# Patient Record
Sex: Male | Born: 1950 | Race: White | Hispanic: No | Marital: Married | Smoking: Never smoker
Health system: Southern US, Community
[De-identification: ages and names within clinical notes are randomized; demographics above are authoritative.]

## PROBLEM LIST (undated history)

## (undated) DIAGNOSIS — H9319 Tinnitus, unspecified ear: Secondary | ICD-10-CM

## (undated) DIAGNOSIS — M791 Myalgia, unspecified site: Secondary | ICD-10-CM

## (undated) DIAGNOSIS — E7800 Pure hypercholesterolemia, unspecified: Secondary | ICD-10-CM

## (undated) DIAGNOSIS — R531 Weakness: Secondary | ICD-10-CM

## (undated) HISTORY — DX: Weakness: R53.1

## (undated) HISTORY — DX: Myalgia, unspecified site: M79.10

## (undated) HISTORY — DX: Tinnitus, unspecified ear: H93.19

## (undated) HISTORY — DX: Pure hypercholesterolemia, unspecified: E78.00

---

## 1983-07-05 HISTORY — PX: OTHER SURGICAL HISTORY: SHX169

## 1991-07-05 HISTORY — PX: NASAL SINUS SURGERY: SHX719

## 2006-10-03 ENCOUNTER — Ambulatory Visit: Payer: Self-pay | Admitting: Gastroenterology

## 2010-02-02 ENCOUNTER — Ambulatory Visit: Payer: Self-pay | Admitting: Cardiology

## 2010-02-10 ENCOUNTER — Ambulatory Visit: Payer: Self-pay | Admitting: Cardiology

## 2010-02-10 ENCOUNTER — Ambulatory Visit (HOSPITAL_COMMUNITY): Admission: RE | Admit: 2010-02-10 | Discharge: 2010-02-10 | Payer: Self-pay | Admitting: Cardiology

## 2010-04-08 ENCOUNTER — Ambulatory Visit: Payer: Self-pay | Admitting: Cardiology

## 2010-10-29 ENCOUNTER — Encounter: Payer: Self-pay | Admitting: Cardiology

## 2010-10-29 DIAGNOSIS — R531 Weakness: Secondary | ICD-10-CM | POA: Insufficient documentation

## 2010-10-29 DIAGNOSIS — H9319 Tinnitus, unspecified ear: Secondary | ICD-10-CM | POA: Insufficient documentation

## 2010-10-29 DIAGNOSIS — M791 Myalgia, unspecified site: Secondary | ICD-10-CM | POA: Insufficient documentation

## 2010-10-29 DIAGNOSIS — H9190 Unspecified hearing loss, unspecified ear: Secondary | ICD-10-CM | POA: Insufficient documentation

## 2010-10-29 DIAGNOSIS — E7800 Pure hypercholesterolemia, unspecified: Secondary | ICD-10-CM | POA: Insufficient documentation

## 2010-11-04 ENCOUNTER — Encounter: Payer: Self-pay | Admitting: Cardiology

## 2010-11-04 ENCOUNTER — Ambulatory Visit (INDEPENDENT_AMBULATORY_CARE_PROVIDER_SITE_OTHER): Payer: BC Managed Care – PPO | Admitting: Cardiology

## 2010-11-04 DIAGNOSIS — R609 Edema, unspecified: Secondary | ICD-10-CM

## 2010-11-04 DIAGNOSIS — E7800 Pure hypercholesterolemia, unspecified: Secondary | ICD-10-CM

## 2010-11-04 DIAGNOSIS — R238 Other skin changes: Secondary | ICD-10-CM

## 2010-11-04 DIAGNOSIS — Z8249 Family history of ischemic heart disease and other diseases of the circulatory system: Secondary | ICD-10-CM

## 2010-11-04 NOTE — Progress Notes (Signed)
Subjective:   Brandon Horn is seen today for followup visit. In general, he seemed primarily for preventive cardiology. He is a Optician, dispensing At [employer]. He is a strong positive family history of heart disease.  He's had some edema of his left lower extremity that occurs mainly with sitting. He has some vague left knee discomfort and also has varicosities that are relatively superficial on leg. Otherwise, he has no significant cardiac issues.  Current Outpatient Prescriptions  Medication Sig Dispense Refill  . aspirin 81 MG tablet Take 81 mg by mouth daily.        Marland Kitchen atorvastatin (LIPITOR) 20 MG tablet Take 20 mg by mouth daily.        . multivitamin (ONE-A-DAY MEN'S) TABS Take by mouth. **GNC**  Multipack for men       . DISCONTD: Prenatal Vit-Fe Fumarate-FA (M-VIT PO) Take by mouth. **GNC**  Multipack for men      . fish oil-omega-3 fatty acids 1000 MG capsule Take 2 g by mouth daily.          No Known Allergies  Patient Active Problem List  Diagnoses  . Hypercholesterolemia  . Myalgia  . Weakness  . Hearing loss  . Ringing in ear    History  Smoking status  . Never Smoker   Smokeless tobacco  . Not on file    History  Alcohol Use No    Family History  Problem Relation Age of Onset  . Heart attack Father 70    Review of Systems:   The patient denies any heat or cold intolerance.  No weight gain or weight loss.  The patient denies headaches or blurry vision.  There is no cough or sputum production.  The patient denies dizziness.  There is no hematuria or hematochezia.  The patient denies any muscle aches or arthritis.  The patient denies any rash.  The patient denies frequent falling or instability.  There is no history of depression or anxiety.  All other systems were reviewed and are negative.   Physical Exam:   Weight is 223. Blood pressure 134/70 sitting, heart rates 80.The head is normocephalic and atraumatic.  Pupils are equally round and reactive to  light.  Sclerae nonicteric.  Conjunctiva is clear.  Oropharynx is unremarkable.  There's adequate oral airway.  Neck is supple there are no masses.  Thyroid is not enlarged.  There is no lymphadenopathy.  Lungs are clear.  Chest is symmetric.  Heart shows a regular rate and rhythm.  S1 and S2 are normal.  There is no murmur click or gallop.  Abdomen is soft normal bowel sounds.  There is no organomegaly.  Genital and rectal deferred.  Extremities are without edema.  Peripheral pulses are adequate.  Neurologically intact.  Full range of motion.  The patient is not depressed.  Skin is warm and dry.  Assessment / Plan:

## 2010-11-05 ENCOUNTER — Encounter: Payer: Self-pay | Admitting: Cardiology

## 2010-11-05 DIAGNOSIS — R609 Edema, unspecified: Secondary | ICD-10-CM | POA: Insufficient documentation

## 2010-11-05 DIAGNOSIS — R238 Other skin changes: Secondary | ICD-10-CM | POA: Insufficient documentation

## 2010-11-05 NOTE — Assessment & Plan Note (Signed)
We'll continue current medications.  Primary care is at [facility]. We'll refer to Dr. Donnie Aho for followup cardiology management.

## 2010-11-05 NOTE — Assessment & Plan Note (Signed)
We'll decrease his aspirin to twice a week. We'll check a CBC with differential to look at his platelet count.

## 2010-11-05 NOTE — Assessment & Plan Note (Signed)
He has minor edema the left leg. It is mainly with sitting. On exam he may have some slight prominence of the popliteal  synovia but I don't think he has a frank Baker's cyst.  He does have slight medial varicosities I think some of this edema is mainly related to venous insufficiency. I've encouraged him to use support stockings and to take occasional aspirin. He is planning a long mission trip to [place] and I encouraged him to wear support stockings on a plane trip. If symptoms persist, he could be referred to Dr. Guss Bunde at the vein clinic.

## 2010-11-08 ENCOUNTER — Other Ambulatory Visit: Payer: Self-pay | Admitting: Cardiology

## 2010-11-08 ENCOUNTER — Other Ambulatory Visit (INDEPENDENT_AMBULATORY_CARE_PROVIDER_SITE_OTHER): Payer: BC Managed Care – PPO | Admitting: *Deleted

## 2010-11-08 DIAGNOSIS — Z79899 Other long term (current) drug therapy: Secondary | ICD-10-CM

## 2010-11-08 DIAGNOSIS — E7800 Pure hypercholesterolemia, unspecified: Secondary | ICD-10-CM

## 2010-11-08 LAB — CBC WITH DIFFERENTIAL/PLATELET
Basophils Absolute: 0 10*3/uL (ref 0.0–0.1)
Eosinophils Relative: 0.9 % (ref 0.0–5.0)
HCT: 41.4 % (ref 39.0–52.0)
Lymphs Abs: 1.9 10*3/uL (ref 0.7–4.0)
MCV: 99.5 fl (ref 78.0–100.0)
Monocytes Absolute: 0.6 10*3/uL (ref 0.1–1.0)
Platelets: 183 10*3/uL (ref 150.0–400.0)
RDW: 14 % (ref 11.5–14.6)

## 2010-11-08 LAB — LIPID PANEL
Cholesterol: 135 mg/dL (ref 0–200)
HDL: 53.8 mg/dL (ref >39.00)
Triglycerides: 46 mg/dL (ref 0.0–149.0)

## 2010-11-08 LAB — HEPATIC FUNCTION PANEL
Alkaline Phosphatase: 47 U/L (ref 39–117)
Bilirubin, Direct: 0.1 mg/dL (ref 0.0–0.3)
Total Bilirubin: 0.9 mg/dL (ref 0.3–1.2)
Total Protein: 6.2 g/dL (ref 6.0–8.3)

## 2010-11-08 LAB — BASIC METABOLIC PANEL
Calcium: 9.1 mg/dL (ref 8.4–10.5)
GFR: 76.12 mL/min (ref >60.00)
Sodium: 140 mEq/L (ref 135–145)

## 2010-11-09 ENCOUNTER — Telehealth: Payer: Self-pay | Admitting: Cardiology

## 2010-11-09 NOTE — Telephone Encounter (Signed)
Lab results reviewed with pt. Pt questioning need for any further change in med or follow-up appt. Dr Deborah Chalk changed his ASA 81mg  daily to 81mg  2x's a week at  Office visit 11/08/10. Informed pt we would call him back if any changes need to be made after discussion with Dr. Deborah Chalk. The best number to reach him on or leave a message at is [phone].

## 2010-12-02 ENCOUNTER — Ambulatory Visit: Payer: Self-pay | Admitting: Cardiology

## 2011-01-10 ENCOUNTER — Other Ambulatory Visit: Payer: Self-pay | Admitting: Cardiology

## 2011-01-10 MED ORDER — ATORVASTATIN CALCIUM 20 MG PO TABS: 20.0000 mg | ORAL_TABLET | Freq: Every day | ORAL | Status: DC

## 2011-01-10 NOTE — Telephone Encounter (Signed)
Med refill

## 2011-08-26 ENCOUNTER — Other Ambulatory Visit: Payer: Self-pay | Admitting: *Deleted

## 2011-09-01 ENCOUNTER — Other Ambulatory Visit: Payer: Self-pay | Admitting: Nurse Practitioner

## 2011-09-01 MED ORDER — ATORVASTATIN CALCIUM 20 MG PO TABS: 20.0000 mg | ORAL_TABLET | Freq: Every day | ORAL | Status: AC

## 2012-12-14 ENCOUNTER — Encounter: Payer: Self-pay | Admitting: Gastroenterology

## 2012-12-24 ENCOUNTER — Encounter: Payer: Self-pay | Admitting: Gastroenterology

## 2012-12-24 ENCOUNTER — Ambulatory Visit (INDEPENDENT_AMBULATORY_CARE_PROVIDER_SITE_OTHER): Payer: BC Managed Care – PPO | Admitting: Gastroenterology

## 2012-12-24 VITALS — BP 110/70 | HR 72 | Ht 76.0 in | Wt 218.5 lb

## 2012-12-24 DIAGNOSIS — K625 Hemorrhage of anus and rectum: Secondary | ICD-10-CM

## 2012-12-24 MED ORDER — NA SULFATE-K SULFATE-MG SULF 17.5-3.13-1.6 GM/177ML PO SOLN: 1.0000 | Freq: Once | ORAL | Status: DC

## 2012-12-24 NOTE — Patient Instructions (Addendum)

## 2012-12-24 NOTE — Assessment & Plan Note (Signed)
Patient has had limited rectal bleeding suggestive of hemorrhoidal bleeding. A more proximal colonic bleeding source should be ruled out.  Recommendations #1 colonoscopy #2 if no abnormalities are seen I will place him on a stool softener or fiber

## 2012-12-24 NOTE — Progress Notes (Signed)
History of Present Illness: Pleasant 62 year old white male referred at the request of Dr. Rosiland Oz for evaluation of rectal bleeding. This has been an intermittent problem for several months consisting of small amounts of bright red blood on the tissue and  occasionally in the water. He tends to have hard stool. He is without abdominal or rectal pain. There has been no change in bowel habits. Patient has never had a colonoscopy.    Past Medical History  Diagnosis Date  . Hypercholesterolemia   . Myalgia   . Weakness   . Hearing loss   . Ringing in ear    Past Surgical History  Procedure Laterality Date  . Nasal sinus surgery  1993  . Eye muscle correction  1985   family history includes Heart attack (age of onset: 56) in his father. Current Outpatient Prescriptions  Medication Sig Dispense Refill  . aspirin 81 MG tablet Take 81 mg by mouth daily.        Marland Kitchen atorvastatin (LIPITOR) 20 MG tablet Take 1 tablet (20 mg total) by mouth daily.  90 tablet  0  . fish oil-omega-3 fatty acids 1000 MG capsule Take 2 g by mouth daily.        . multivitamin (ONE-A-DAY MEN'S) TABS Take by mouth. **GNC**  Multipack for men       . [DISCONTINUED] Prenatal Vit-Fe Fumarate-FA (M-VIT PO) Take by mouth. **GNC**  Multipack for men       No current facility-administered medications for this visit.   Allergies as of 12/24/2012  . (No Known Allergies)    reports that he has never smoked. He does not have any smokeless tobacco history on file. He reports that he does not drink alcohol or use illicit drugs.     Review of Systems: Pertinent positive and negative review of systems were noted in the above HPI section. All other review of systems were otherwise negative.  Vital signs were reviewed in today's medical record Physical Exam: General: Well developed , well nourished, no acute distress Skin: anicteric Head: Normocephalic and atraumatic Eyes:  sclerae anicteric, EOMI Ears: Normal auditory  acuity Mouth: No deformity or lesions Neck: Supple, no masses or thyromegaly Lungs: Clear throughout to auscultation Heart: Regular rate and rhythm; no murmurs, rubs or bruits Abdomen: Soft, non tender and non distended. No masses, hepatosplenomegaly or hernias noted. Normal Bowel sounds Rectal:deferred Musculoskeletal: Symmetrical with no gross deformities  Skin: No lesions on visible extremities Pulses:  Normal pulses noted Extremities: No clubbing, cyanosis, edema or deformities noted Neurological: Alert oriented x 4, grossly nonfocal Cervical Nodes:  No significant cervical adenopathy Inguinal Nodes: No significant inguinal adenopathy Psychological:  Alert and cooperative. Normal mood and affect

## 2013-01-22 ENCOUNTER — Encounter: Payer: Self-pay | Admitting: Gastroenterology

## 2013-01-22 ENCOUNTER — Ambulatory Visit (AMBULATORY_SURGERY_CENTER): Payer: BC Managed Care – PPO | Admitting: Gastroenterology

## 2013-01-22 VITALS — BP 117/74 | HR 54 | Temp 97.5°F | Resp 28 | Ht 76.0 in | Wt 218.0 lb

## 2013-01-22 DIAGNOSIS — K625 Hemorrhage of anus and rectum: Secondary | ICD-10-CM

## 2013-01-22 DIAGNOSIS — Z1211 Encounter for screening for malignant neoplasm of colon: Secondary | ICD-10-CM

## 2013-01-22 MED ORDER — SODIUM CHLORIDE 0.9 % IV SOLN: 500.0000 mL | INTRAVENOUS | Status: DC

## 2013-01-22 NOTE — Progress Notes (Signed)
Patient did not experience any of the following events: a burn prior to discharge; a fall within the facility; wrong site/side/patient/procedure/implant event; or a hospital transfer or hospital admission upon discharge from the facility. (G8907) Patient did not have preoperative order for IV antibiotic SSI prophylaxis. (G8918)  

## 2013-01-22 NOTE — Patient Instructions (Addendum)
Discharge instructions given with verbal understanding. Normal exam. Resume previous medications. YOU HAD AN ENDOSCOPIC PROCEDURE TODAY AT THE [facility]: Refer to the procedure report that was given to you for any specific questions about what was found during the examination.  If the procedure report does not answer your questions, please call your gastroenterologist to clarify.  If you requested that your care partner not be given the details of your procedure findings, then the procedure report has been included in a sealed envelope for you to review at your convenience later.  YOU SHOULD EXPECT: Some feelings of bloating in the abdomen. Passage of more gas than usual.  Walking can help get rid of the air that was put into your GI tract during the procedure and reduce the bloating. If you had a lower endoscopy (such as a colonoscopy or flexible sigmoidoscopy) you may notice spotting of blood in your stool or on the toilet paper. If you underwent a bowel prep for your procedure, then you may not have a normal bowel movement for a few days.  DIET: Your first meal following the procedure should be a light meal and then it is ok to progress to your normal diet.  A half-sandwich or bowl of soup is an example of a good first meal.  Heavy or fried foods are harder to digest and may make you feel nauseous or bloated.  Likewise meals heavy in dairy and vegetables can cause extra gas to form and this can also increase the bloating.  Drink plenty of fluids but you should avoid alcoholic beverages for 24 hours.  ACTIVITY: Your care partner should take you home directly after the procedure.  You should plan to take it easy, moving slowly for the rest of the day.  You can resume normal activity the day after the procedure however you should NOT DRIVE or use heavy machinery for 24 hours (because of the sedation medicines used during the test).    SYMPTOMS TO REPORT IMMEDIATELY: A gastroenterologist  can be reached at any hour.  During normal business hours, 8:30 AM to 5:00 PM Monday through Friday, call [phone].  After hours and on weekends, please call the GI answering service at [phone] who will take a message and have the physician on call contact you.   Following lower endoscopy (colonoscopy or flexible sigmoidoscopy):  Excessive amounts of blood in the stool  Significant tenderness or worsening of abdominal pains  Swelling of the abdomen that is new, acute  Fever of 100F or higher  FOLLOW UP: If any biopsies were taken you will be contacted by phone or by letter within the next 1-3 weeks.  Call your gastroenterologist if you have not heard about the biopsies in 3 weeks.  Our staff will call the home number listed on your records the next business day following your procedure to check on you and address any questions or concerns that you may have at that time regarding the information given to you following your procedure. This is a courtesy call and so if there is no answer at the home number and we have not heard from you through the emergency physician on call, we will assume that you have returned to your regular daily activities without incident.  SIGNATURES/CONFIDENTIALITY: You and/or your care partner have signed paperwork which will be entered into your electronic medical record.  These signatures attest to the fact that that the information above on your After Visit Summary has been reviewed   and is understood.  Full responsibility of the confidentiality of this discharge information lies with you and/or your care-partner. 

## 2013-01-22 NOTE — Progress Notes (Signed)
A/ox3 pleased with MAC, report to Celia RN 

## 2013-01-22 NOTE — Op Note (Signed)
[facility] [address] [place], [address]   COLONOSCOPY PROCEDURE REPORT  PATIENT: Brandon Horn, Brandon Horn  MR#: [id number] BIRTHDATE: [birth year] , 61  yrs. old GENDER: Male ENDOSCOPIST: Louis Meckel, MD REFERRED BY: PROCEDURE DATE:  01/22/2013 PROCEDURE:   Colonoscopy, diagnostic ASA CLASS:   Class I INDICATIONS: MEDICATIONS: MAC sedation, administered by CRNA and propofol (Diprivan) 300mg  IV  DESCRIPTION OF PROCEDURE:   After the risks benefits and alternatives of the procedure were thoroughly explained, informed consent was obtained.  A digital rectal exam revealed no abnormalities of the rectum.   The LB [id number]  endoscope was introduced through the anus and advanced to the cecum, which was identified by both the appendix and ileocecal valve. No adverse events experienced.   The quality of the prep was excellent using Suprep  The instrument was then slowly withdrawn as the colon was fully examined.      COLON FINDINGS:   A normal appearing cecum, ileocecal valve, and appendiceal orifice were identified.  The ascending, hepatic flexure, transverse, splenic flexure, descending, sigmoid colon and rectum appeared unremarkable.  No polyps or cancers were seen. Retroflexed views revealed no abnormalities. The time to cecum=6 minutes 22 seconds.  Withdrawal time=8 minutes 03 seconds.  The scope was withdrawn and the procedure completed. COMPLICATIONS: There were no complications.  ENDOSCOPIC IMPRESSION: 1.    normal colon  RECOMMENDATIONS: Continue current colorectal screening recommendations for "routine risk" patients with a repeat colonoscopy in 10 years.   eSigned:  Louis Meckel, MD 01/22/2013 3:23 PM   cc: Ellwood Handler, M.D.

## 2013-01-23 ENCOUNTER — Telehealth: Payer: Self-pay

## 2013-01-23 NOTE — Telephone Encounter (Signed)
  Follow up Call-  Call back number 01/22/2013  Post procedure Call Back phone  # [phone]  Permission to leave phone message Yes     Patient questions:  Do you have a fever, pain , or abdominal swelling? no Pain Score  0 *  Have you tolerated food without any problems? yes  Have you been able to return to your normal activities? yes  Do you have any questions about your discharge instructions: Diet   no Medications  no Follow up visit  no  Do you have questions or concerns about your Care? no  Actions: * If pain score is 4 or above: No action needed, pain <4.  No problems per the pt. Maw

## 2017-09-26 ENCOUNTER — Encounter (INDEPENDENT_AMBULATORY_CARE_PROVIDER_SITE_OTHER): Payer: Self-pay

## 2017-09-26 ENCOUNTER — Ambulatory Visit: Payer: Medicare Other | Admitting: Neurology

## 2017-09-26 ENCOUNTER — Telehealth: Payer: Self-pay | Admitting: Neurology

## 2017-09-26 ENCOUNTER — Encounter: Payer: Self-pay | Admitting: Neurology

## 2017-09-26 ENCOUNTER — Other Ambulatory Visit: Payer: Self-pay

## 2017-09-26 VITALS — BP 126/80 | HR 75 | Ht 76.0 in | Wt 228.5 lb

## 2017-09-26 DIAGNOSIS — E538 Deficiency of other specified B group vitamins: Secondary | ICD-10-CM | POA: Diagnosis not present

## 2017-09-26 DIAGNOSIS — Z5181 Encounter for therapeutic drug level monitoring: Secondary | ICD-10-CM | POA: Diagnosis not present

## 2017-09-26 DIAGNOSIS — R404 Transient alteration of awareness: Secondary | ICD-10-CM

## 2017-09-26 NOTE — Telephone Encounter (Signed)
[employer] medicare Berkley Harveyauth: [id number] (exp. 09/26/17 to 10/25/17) order sent to GI they will reach out to the patient to schedule.

## 2017-09-26 NOTE — Patient Instructions (Signed)
   We will check MRI of the brain and check an EEG study. We will get a carotid doppler.

## 2017-09-26 NOTE — Progress Notes (Signed)
Reason for visit: Transient alteration of awareness  Referring physician: Dr. Alinda Horn is a 67 y.o. male  History of present illness:  Brandon Horn is a 67 year old left-handed white male with a history of episodes of flushing sensations that have been present off and on since 2011.  As times gone on, these episodes have become a bit more frequent.  He initially attributed this to multivitamins that contain niacin.  He went off the vitamins but the episodes continued.  The patient has had some episodes as well with the flushing of some sensation of gurgling in the abdomen, he has not had any of these events in over one year.  The patient has gone on to continue to have episodes of flushing that has a sensation mainly on the left side of the body that may include the upper left leg, body, and arm.  Beginning in December 2018, the patient has also had episodes of cognitive alteration with these episodes.  On one occasion he had a cold sensation involving the left arm.  The patient is having very frequent events at this point, essentially daily, he may have multiple episodes in a day.  The episodes last only about 5 seconds and resolve.  If he is talking, he may have difficulty getting the words out, one episode occurred while playing the guitar, he was unable to continue playing.  Once the event is over, he returns back to normal cognitive functioning.  The patient does not lose consciousness.  He denies headaches or dizziness.  He has no history of migraine.  His sister has a history of generalized tonic-clonic seizures.  The patient reports no weakness or persistent numbness of the arms or legs or face.  He denies issues with balance or difficulty controlling the bowels or the bladder.  He has had events while driving which fortunately have not impaired his ability to operate a motor vehicle.  He comes to this office for an evaluation.  Past Medical History:  Diagnosis Date  . Hearing  loss   . Hypercholesterolemia   . Myalgia   . Ringing in ear   . Weakness     Past Surgical History:  Procedure Laterality Date  . EYE MUSCLE CORRECTION  1985  . NASAL SINUS SURGERY  1993    Family History  Problem Relation Age of Onset  . Heart attack Father 36  . Seizures Sister     Social history:  reports that he has never smoked. He has never used smokeless tobacco. He reports that he does not drink alcohol or use drugs.  Medications:  Prior to Admission medications   Medication Sig Start Date End Date Taking? Authorizing Provider  aspirin 81 MG tablet Take 81 mg by mouth daily.     Yes [provider]  atorvastatin (LIPITOR) 20 MG tablet Take 1 tablet (20 mg total) by mouth daily. 09/01/11  Yes Rosalio Macadamia, NP  Prenatal Vit-Fe Fumarate-FA (M-VIT PO) Take by mouth. **GNC**  Multipack for men  11/04/10  [provider]     No Known Allergies  ROS:  Out of a complete 14 system review of symptoms, the patient complains only of the following symptoms, and all other reviewed systems are negative.  Swelling in the legs Ringing in the ears Transient confusion  Blood pressure 126/80, pulse 75, height 6\' 4"  (1.93 m), weight 228 lb 8 oz (103.6 kg).  Physical Exam  General: The patient is alert and cooperative  at the time of the examination.  Eyes: Pupils are equal, round, and reactive to light. Discs are flat bilaterally.  Neck: The neck is supple, no carotid bruits are noted.  Respiratory: The respiratory examination is clear.  Cardiovascular: The cardiovascular examination reveals a regular rate and rhythm, no obvious murmurs or rubs are noted.  Skin: Extremities are without significant edema.  Neurologic Exam  Mental status: The patient is alert and oriented x 3 at the time of the examination. The patient has apparent normal recent and remote memory, with an apparently normal attention span and concentration ability.  Cranial nerves: Facial  symmetry is present. There is good sensation of the face to pinprick and soft touch bilaterally. The strength of the facial muscles and the muscles to head turning and shoulder shrug are normal bilaterally. Speech is well enunciated, no aphasia or dysarthria is noted. Extraocular movements are full. Visual fields are full. The tongue is midline, and the patient has symmetric elevation of the soft palate. No obvious hearing deficits are noted.  Motor: The motor testing reveals 5 over 5 strength of all 4 extremities. Good symmetric motor tone is noted throughout.  Sensory: Sensory testing is intact to pinprick, soft touch, vibration sensation, and position sense on all 4 extremities, with exception of a stocking pattern pinprick sensory deficit across the ankles bilaterally and some reduction in vibration sensation in both feet. No evidence of extinction is noted.  Coordination: Cerebellar testing reveals good finger-nose-finger and heel-to-shin bilaterally.  Gait and station: Gait is normal. Tandem gait is normal. Romberg is negative. No drift is seen.  Reflexes: Deep tendon reflexes are symmetric and normal bilaterally. Toes are downgoing bilaterally.   Assessment/Plan:  1.  Transient alteration of awareness  The patient is having multiple episodes of left-sided flushing sensations, and some cognitive alterations lasting only 5 or 10 seconds.  The episodes may represent brief seizure events, TIA events are less likely.  The patient will be set up for MRI of the brain with and without gadolinium enhancement.  He will have blood work today.  The patient will have an EEG study, he will have a carotid Doppler study.  He will follow-up in 5 months.  We may potentially consider an empiric trial on antiepileptic medications.  Brandon Horn. Brandon Braylee Lal MD 09/26/2017 3:04 PM  [facility] [address] [place], [place] [id number]  Phone [phone] Fax [phone]

## 2017-09-26 NOTE — Telephone Encounter (Signed)
Dr. Anne HahnWillis requested for patient to have a 5 month follow-up. Patient stated that he is moving in June, 2019. He asked if he could be seen before he moves. Received RN approval and scheduled patient for the end of May.

## 2017-09-27 ENCOUNTER — Other Ambulatory Visit: Payer: Self-pay | Admitting: Neurology

## 2017-09-27 ENCOUNTER — Telehealth: Payer: Self-pay | Admitting: *Deleted

## 2017-09-27 DIAGNOSIS — T1592XS Foreign body on external eye, part unspecified, left eye, sequela: Secondary | ICD-10-CM

## 2017-09-27 LAB — COMPREHENSIVE METABOLIC PANEL
ALT: 20 IU/L (ref 0–44)
AST: 14 IU/L (ref 0–40)
Albumin/Globulin Ratio: 2 (ref 1.2–2.2)
Albumin: 4.2 g/dL (ref 3.6–4.8)
Alkaline Phosphatase: 59 IU/L (ref 39–117)
BILIRUBIN TOTAL: 0.3 mg/dL (ref 0.0–1.2)
BUN/Creatinine Ratio: 19 (ref 10–24)
BUN: 20 mg/dL (ref 8–27)
CALCIUM: 9.1 mg/dL (ref 8.6–10.2)
CHLORIDE: 104 mmol/L (ref 96–106)
CO2: 25 mmol/L (ref 20–29)
Creatinine, Ser: 1.03 mg/dL (ref 0.76–1.27)
GFR calc non Af Amer: 76 mL/min/{1.73_m2} (ref >59)
GFR, EST AFRICAN AMERICAN: 88 mL/min/{1.73_m2} (ref >59)
GLUCOSE: 99 mg/dL (ref 65–99)
Globulin, Total: 2.1 g/dL (ref 1.5–4.5)
Potassium: 4 mmol/L (ref 3.5–5.2)
Sodium: 141 mmol/L (ref 134–144)
TOTAL PROTEIN: 6.3 g/dL (ref 6.0–8.5)

## 2017-09-27 LAB — VITAMIN B12: VITAMIN B 12: 487 pg/mL (ref 232–1245)

## 2017-09-27 NOTE — Telephone Encounter (Signed)
Called, LVM for pt to call about lab results.   Okay to inform him labs unremarkable per CW,MD note if he calls

## 2017-09-27 NOTE — Telephone Encounter (Signed)
FYI Pt returned the call and he was advised labs unremarkable , no questions no request for a call back

## 2017-09-27 NOTE — Telephone Encounter (Signed)
Noted, thank you

## 2017-09-27 NOTE — Telephone Encounter (Signed)
-----   Message from York Spanielharles K Willis, MD sent at 09/27/2017  7:25 AM EDT -----   The blood work results are unremarkable. Please call the patient.  ----- Message ----- From: Nell RangeInterface, [facility] Lab Results In Sent: 09/27/2017   5:40 AM To: York Spanielharles K Willis, MD

## 2017-10-06 ENCOUNTER — Ambulatory Visit
Admission: RE | Admit: 2017-10-06 | Discharge: 2017-10-06 | Disposition: A | Discharge status: Home / Self Care | Payer: Medicare Other | Source: Ambulatory Visit | Attending: Neurology | Admitting: Neurology

## 2017-10-06 DIAGNOSIS — R404 Transient alteration of awareness: Secondary | ICD-10-CM

## 2017-10-06 DIAGNOSIS — T1592XS Foreign body on external eye, part unspecified, left eye, sequela: Secondary | ICD-10-CM

## 2017-10-08 ENCOUNTER — Telehealth: Payer: Self-pay | Admitting: Neurology

## 2017-10-08 DIAGNOSIS — R404 Transient alteration of awareness: Secondary | ICD-10-CM

## 2017-10-08 NOTE — Telephone Encounter (Signed)
I called the patient.  The orbital x-ray showed metal, the patient cannot have MRI.  If he is amenable to it, we will check CT scan of the brain instead.  Renal function has been checked recently and is unremarkable.  The patient will call our office if he is amenable to having a CT.

## 2017-10-09 ENCOUNTER — Telehealth: Payer: Self-pay | Admitting: Neurology

## 2017-10-09 NOTE — Telephone Encounter (Signed)
I called the patient, I will go ahead and schedule the CT of the head.

## 2017-10-09 NOTE — Telephone Encounter (Signed)
Patient called and I advised an order will be put in for a CT of the head per previous message and someone will call him to schedule. A returned call is not needed.

## 2017-10-09 NOTE — Telephone Encounter (Signed)
Patient called in stating that he would like a call back from Dr. Anne HahnWillis or the nurse. He informed me that he had a Xray done on Friday and it showed metal. They informed me that he could have a CT but he hasn't heard anything about scheduling a CT.

## 2017-10-09 NOTE — Addendum Note (Signed)
Addended by: York SpanielWILLIS, Binyomin Brann K on: 10/09/2017 01:28 PM   Modules accepted: Orders

## 2017-10-09 NOTE — Telephone Encounter (Signed)
[employer] Berkley Harveyauth: [id number] (exp. 10/09/17 to 11/07/17) order sent to GI. They will reach out to the pt to schedule.

## 2017-10-19 ENCOUNTER — Ambulatory Visit
Admission: RE | Admit: 2017-10-19 | Discharge: 2017-10-19 | Disposition: A | Discharge status: Home / Self Care | Payer: Medicare Other | Source: Ambulatory Visit | Attending: Neurology | Admitting: Neurology

## 2017-10-19 ENCOUNTER — Telehealth: Payer: Self-pay | Admitting: Neurology

## 2017-10-19 DIAGNOSIS — R404 Transient alteration of awareness: Secondary | ICD-10-CM

## 2017-10-19 DIAGNOSIS — R569 Unspecified convulsions: Secondary | ICD-10-CM

## 2017-10-19 MED ORDER — LEVETIRACETAM 500 MG PO TABS: 500.0000 mg | ORAL_TABLET | Freq: Two times a day (BID) | ORAL | 3 refills | Status: AC

## 2017-10-19 MED ORDER — IOPAMIDOL (ISOVUE-300) INJECTION 61%
75.0000 mL | Freq: Once | INTRAVENOUS | Status: AC | PRN
  Administered 2017-10-19: 75 mL via INTRAVENOUS

## 2017-10-19 NOTE — Telephone Encounter (Signed)
  I called the patient.  The CT scan of the brain shows a lesion within the left temporal lobe with calcification, may represent an AVM or an oligodendroglioma or a ganglioneuroma.  The metal that they saw on the orbital films appears to be intraosseous, it is felt that MRI of the brain would be safe, I will get this ordered.  Given this brain lesion, the clinical episodes likely do represent seizures, I will start Keppra taking 250 mg twice daily for 2 weeks and then go to 500 mg twice daily.  CT brain 10/19/17:  IMPRESSION: 1. Hyperdense, partially calcified, and perhaps mildly enhancing round soft tissue mass at the mesial right temporal lobe at the amygdala. Follow-up brain MRI without and with contrast (see #4) would best characterize further. Top differential considerations at this point include ganglioglioma, oligodendroglioma, CNS lymphoma, less likely meningioma, high-grade glioma, or AVM. 2. Normal for age CT appearance of the brain elsewhere. 3. Partially visible nonspecific left suboccipital scalp soft tissue nodule versus mildly enlarged level 5 lymph node. Is there a dermal lesion evident on physical exam which might explain this finding (sebaceous cyst)?. 4. The left orbit associated metallic foreign bodies are intraosseous. MRI of the brain in this patient should be safe to perform with regard to the orbits.

## 2017-10-20 ENCOUNTER — Telehealth: Payer: Self-pay | Admitting: Neurology

## 2017-10-20 NOTE — Telephone Encounter (Signed)
Pt is concerned after talking with Dr Anne HahnWillis yesterday if he feels he needs to keep appt for carotid on 4/24 and EEG on 5/13. Pt said he does not want to go thru a battery of test if it is not needed. Dr Anne HahnWillis please call to advise

## 2017-10-20 NOTE — Telephone Encounter (Signed)
I called the patient.  The patient appropriately is wondering whether the EEG study and the carotid Doppler study is reasonable to do at this time, given the findings by CT of the brain, I will cancel both of the studies.  The patient likely is having seizures, whether the EEG is normal or not he will be treated with anticonvulsant therapy.

## 2017-10-23 ENCOUNTER — Telehealth: Payer: Self-pay | Admitting: Neurology

## 2017-10-23 NOTE — Telephone Encounter (Signed)
According to the radiologist who read the CT scan, Dr. Margo AyeHall, the patient is safe to have MRI as the metal appears to be encased within bone.

## 2017-10-23 NOTE — Telephone Encounter (Signed)
Noted, I call [facility] back and informed them per Dr. Anne HahnWillis and Dr. Margo AyeHall the patient is okay to have the MRI due to the metal is encased within bone.

## 2017-10-23 NOTE — Telephone Encounter (Signed)
Patient is scheduled at GI for 10/26/17 for the MRI. [employer] Medicare Berkley Harveyauth: [id number] (exp. 10/23/17 to 11/21/17)

## 2017-10-23 NOTE — Telephone Encounter (Signed)
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

## 2017-10-25 ENCOUNTER — Ambulatory Visit (HOSPITAL_COMMUNITY): Payer: Medicare Other

## 2017-10-26 ENCOUNTER — Other Ambulatory Visit: Payer: Medicare Other

## 2017-10-26 ENCOUNTER — Ambulatory Visit
Admission: RE | Admit: 2017-10-26 | Discharge: 2017-10-26 | Disposition: A | Discharge status: Home / Self Care | Payer: Medicare Other | Source: Ambulatory Visit | Attending: Neurology | Admitting: Neurology

## 2017-10-26 DIAGNOSIS — R569 Unspecified convulsions: Secondary | ICD-10-CM

## 2017-10-26 MED ORDER — GADOBENATE DIMEGLUMINE 529 MG/ML IV SOLN
20.0000 mL | Freq: Once | INTRAVENOUS | Status: AC | PRN
  Administered 2017-10-26: 20 mL via INTRAVENOUS

## 2017-10-27 NOTE — Progress Notes (Signed)
Please advise pt that the recent MRI confirms a mass in the L temporal area, similar to what was noted on the prior head CT. Dr. Anne HahnWillis ordered an MRI w/wo contrast, but it appears, he did not receive contrast, I am not sure why. He would benefit from a contrasted MRI, which I can order after you talk to him. Furthermore, he may benefit from consultations with NSG and/or neurooncology, I am not certain, if this has been discussed with Dr. Anne HahnWillis yet? Pls let patient know, that no other new finding was seen, no stroke either. Can you find out, why contrast was not given? Kidney function is okay.  Janene Harveysa

## 2017-10-30 ENCOUNTER — Telehealth: Payer: Self-pay | Admitting: *Deleted

## 2017-10-30 NOTE — Telephone Encounter (Addendum)
Error

## 2017-10-31 ENCOUNTER — Telehealth: Payer: Self-pay | Admitting: Neurology

## 2017-10-31 DIAGNOSIS — G9389 Other specified disorders of brain: Secondary | ICD-10-CM

## 2017-10-31 NOTE — Telephone Encounter (Signed)
I called the patient.  The area of tumor does enhance slightly, the next step would be to get a neurosurgical opinion regarding this, I will try to get this set up.  The patient is amenable to this.    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 Contrast MRI would be beneficial.There are incidental changes of chronic paranasal sinusitis.  I have reviewed MRI scan of the brain with contrast and only axial images are available for review for some reason coronal and sagittal images are not obtained or available at the present time. Postcontrast axial images show spotty enhancement in the right posterior medial temporal lesion. The differential diagnosis now becomes dysembryoplastic neuroectodermal tumor ,gangliocytoma, subependymoma myeloma. Cavernoma is less likely. Case discussed with Dr. Augusto Gamble neuroradiologist.

## 2017-10-31 NOTE — Telephone Encounter (Signed)
I have already called the patient, we will set up a neurosurgical referral, please refer to phone note.

## 2017-10-31 NOTE — Telephone Encounter (Signed)
Pt requesting a call to discuss his MRI results as soon as possible

## 2017-11-01 NOTE — Telephone Encounter (Signed)
Called [facility] and spoke with Lauren in MRI department. Advised coronal and sagittal images missing. We have axial images. They are showing on their end.  They will resend for [place] to review.

## 2017-11-03 ENCOUNTER — Ambulatory Visit: Payer: Medicare Other | Admitting: Neurology

## 2017-11-06 ENCOUNTER — Other Ambulatory Visit: Payer: Medicare Other

## 2017-11-06 ENCOUNTER — Encounter

## 2017-11-13 ENCOUNTER — Other Ambulatory Visit: Payer: Medicare Other

## 2017-11-30 ENCOUNTER — Ambulatory Visit: Payer: Medicare Other | Admitting: Neurology

## 2019-07-29 IMAGING — CR DG ORBITS FOR FOREIGN BODY
2 series · 2 of 2 positions shown · non-contrast
Comparison: None.

CLINICAL DATA: Altered level of consciousness.  Pre screening MRI.

EXAM:
ORBITS FOR FOREIGN BODY - 2 VIEW

[w orbit pa (1 of 2)]
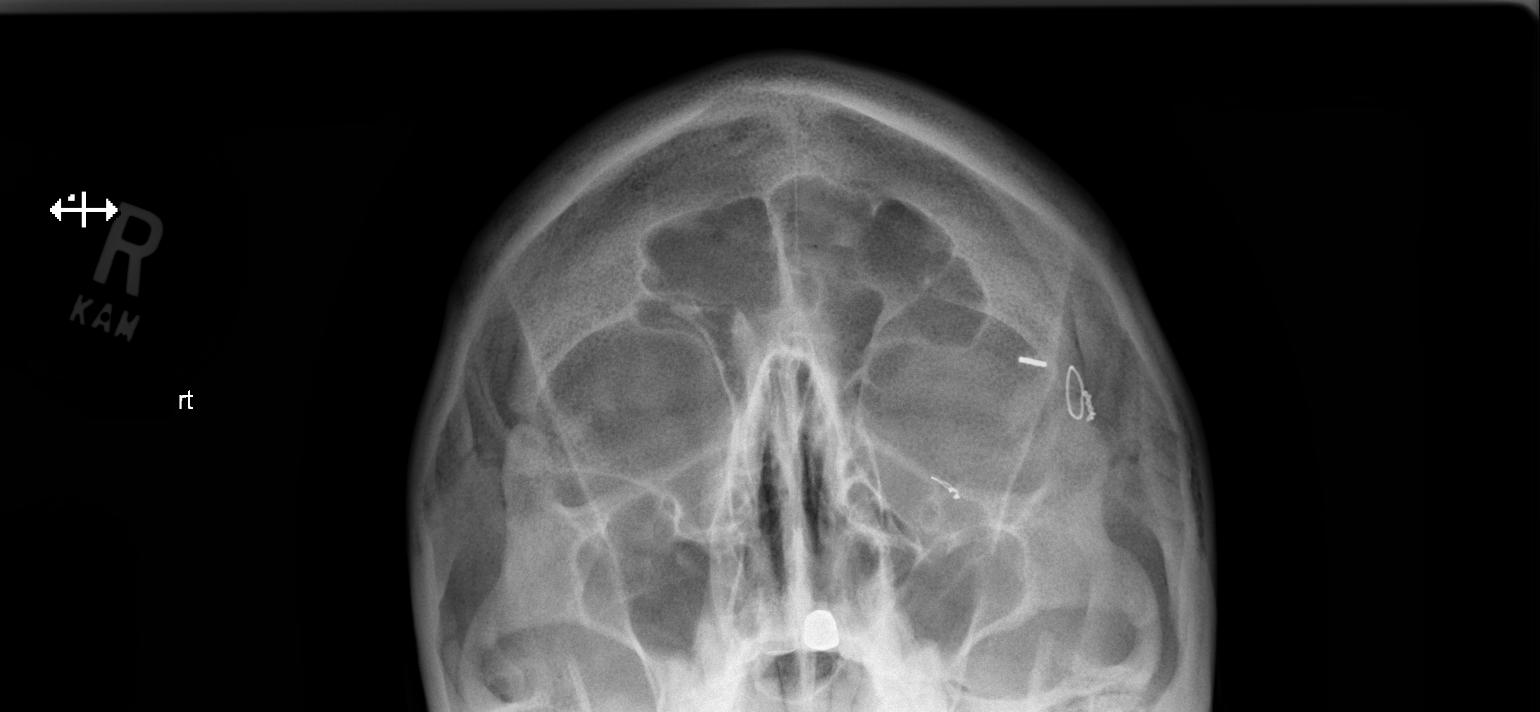

[w orbit pa (2 of 2)]
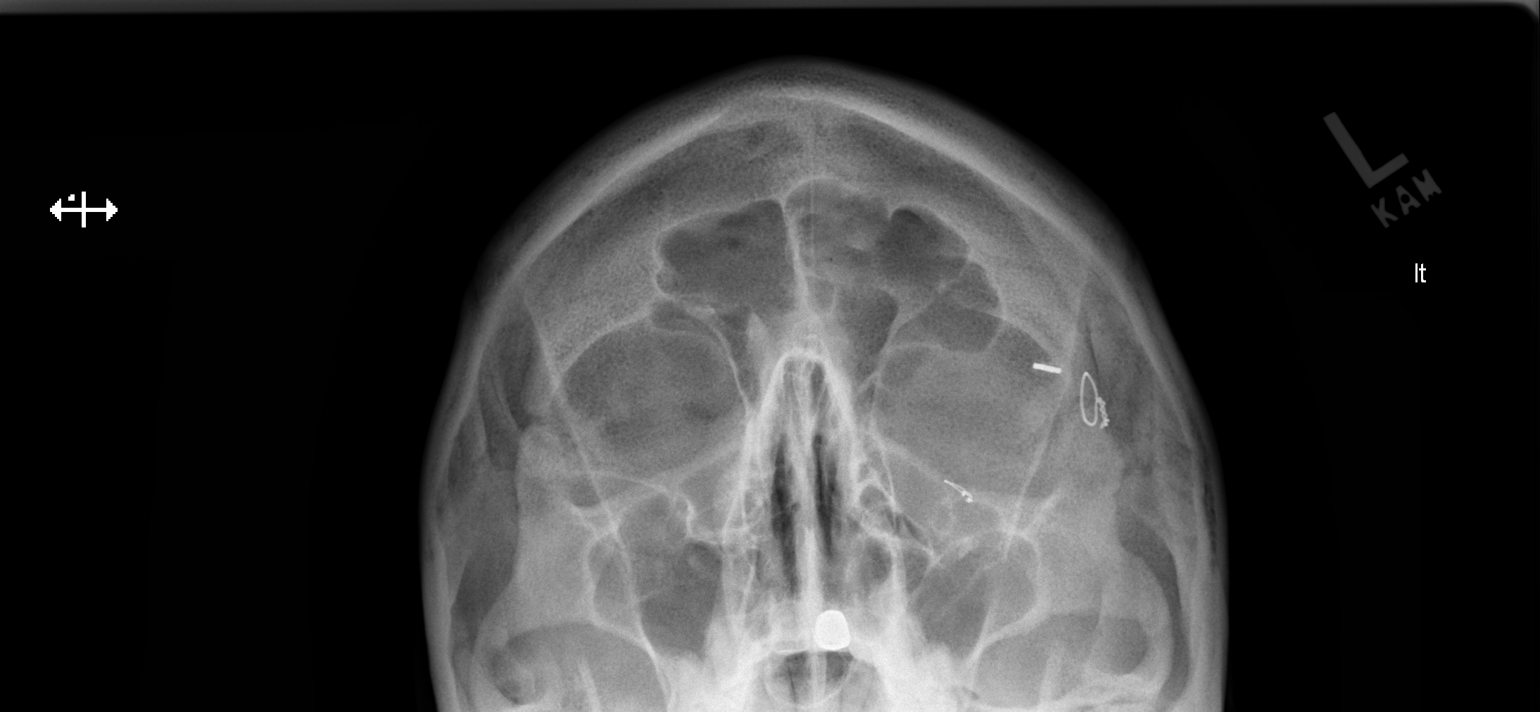

[2 of 2 positions shown; findings below may reference images not displayed]

FINDINGS: Small intact cerclage wires LEFT inferior and lateral orbital wall.
In addition, a surgical clip projects in the LEFT orbit.
IMPRESSION: Metallic foreign bodies projecting within and about the LEFT orbit.
MRI is contraindicated.

These results will be called to the ordering clinician or
representative by the [HOSPITAL] at the imaging location.
a

## 2019-08-11 IMAGING — CT CT HEAD WO/W CM
1 of 2 series · 13 of 30 positions shown, 17 images · IV contrast (iopamidol)
Comparison: Orbit radiographs 10/06/2017

ADDENDUM:
Study discussed by telephone with Dr. RIEZEN MATT II on 10/19/2017 at
7557 hours.
CLINICAL DATA: 66-year-old male with episodes of altered mental
status, mental confusion for 1 year. Personal history of orbital
trauma at age 19 from baseball.

EXAM:
CT HEAD WITHOUT AND WITH CONTRAST
TECHNIQUE: Contiguous axial images were obtained from the base of the skull
through the vertex without and with intravenous contrast
CONTRAST:  75mL TC8XAX-VAA IOPAMIDOL (TC8XAX-VAA) INJECTION 61%

[Series 2: head w/(date) · axial · 0.48mm/px · z∈[-144,-9]mm · 13 of 33 slices shown, 17 images]
[im 3/33  brain]
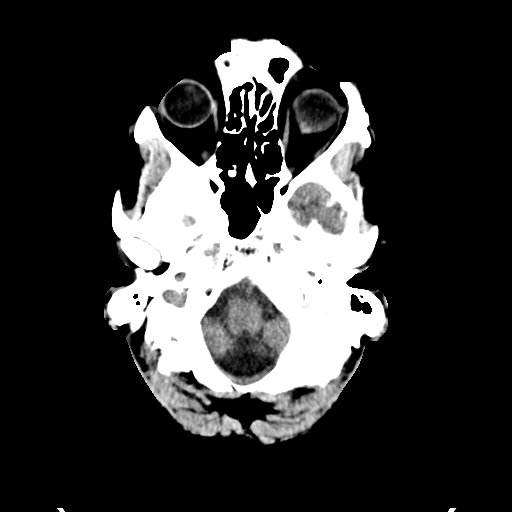
[im 3/33  bone]
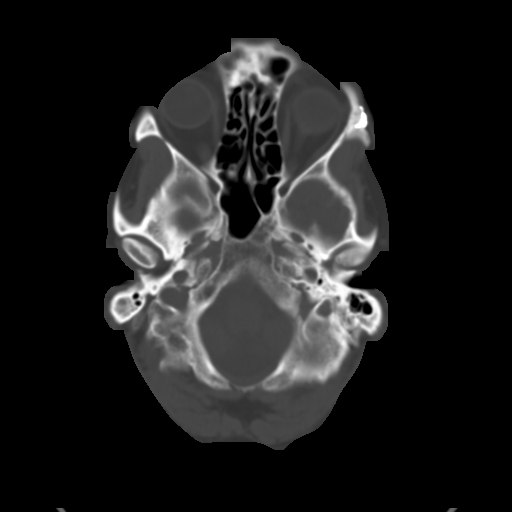
[im 5/33  brain]
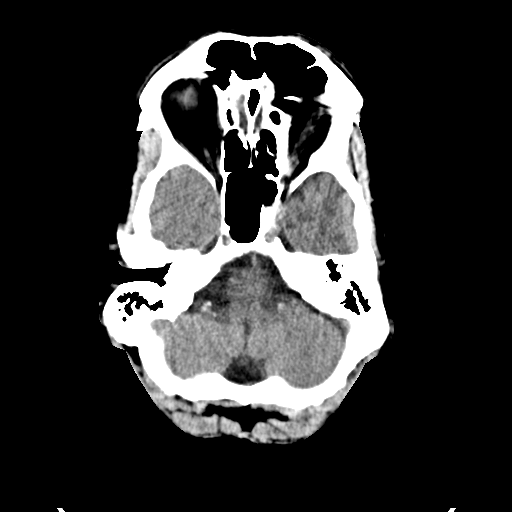
[im 7/33  brain]
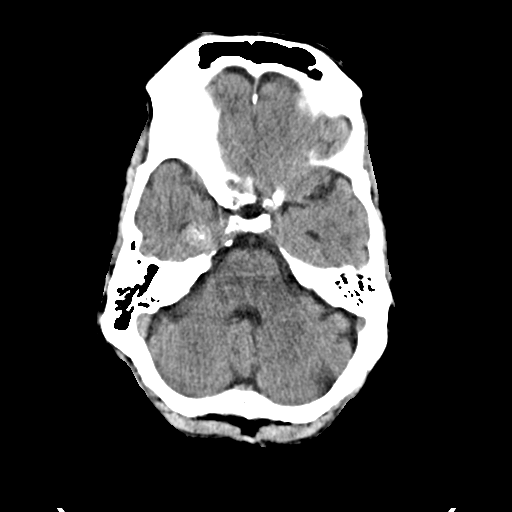
[im 10/33  brain]
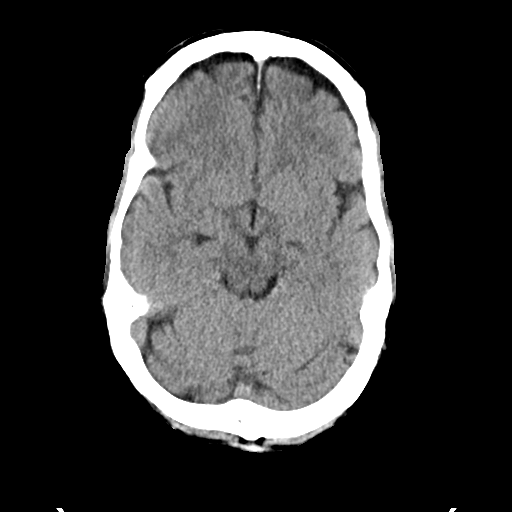
[im 12/33  brain]
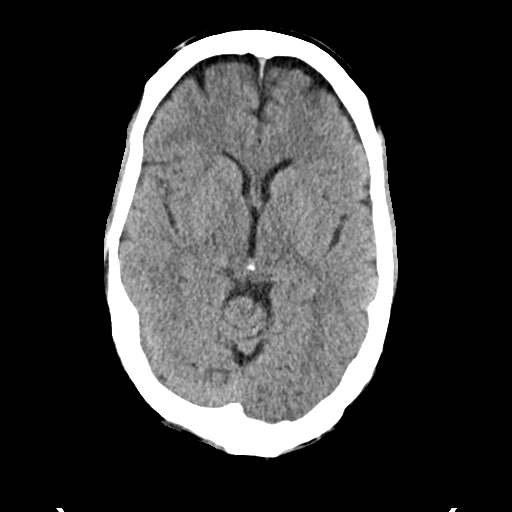
[im 12/33  bone]
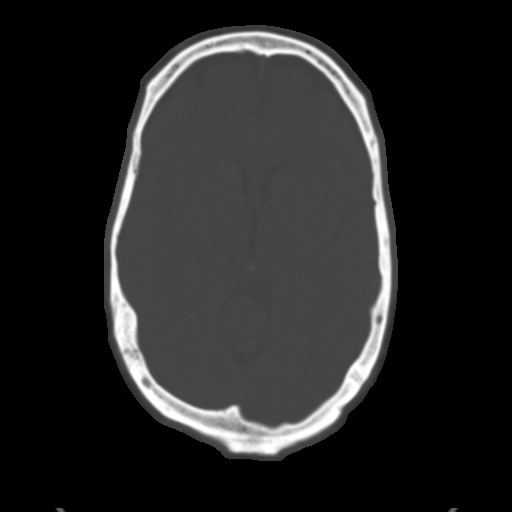
[im 14/33  brain]
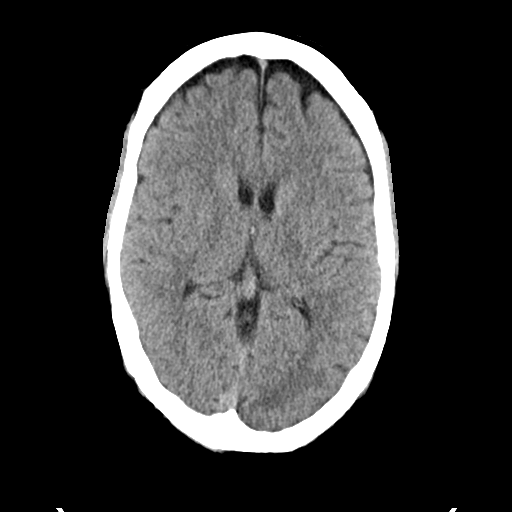
[im 17/33  brain]
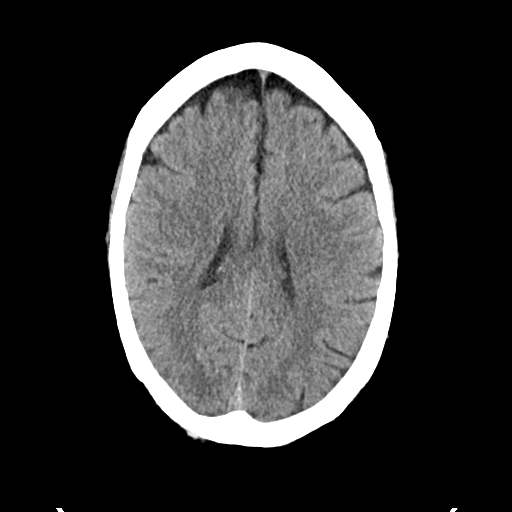
[im 19/33  brain]
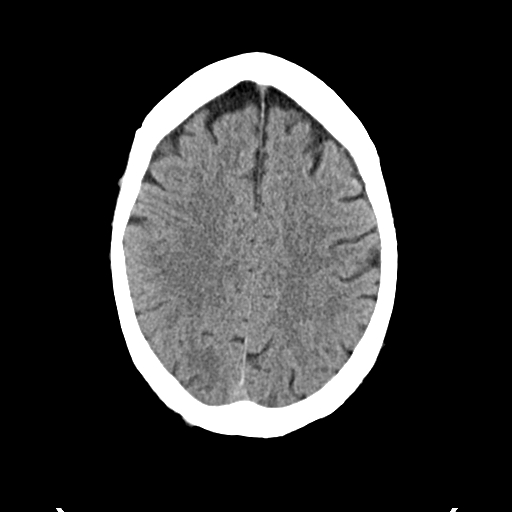
[im 21/33  brain]
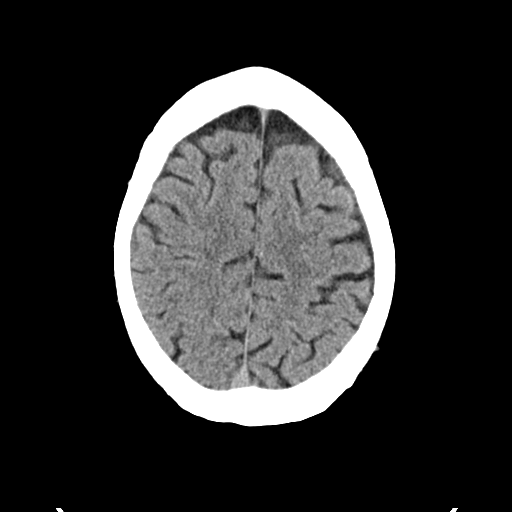
[im 21/33  bone]
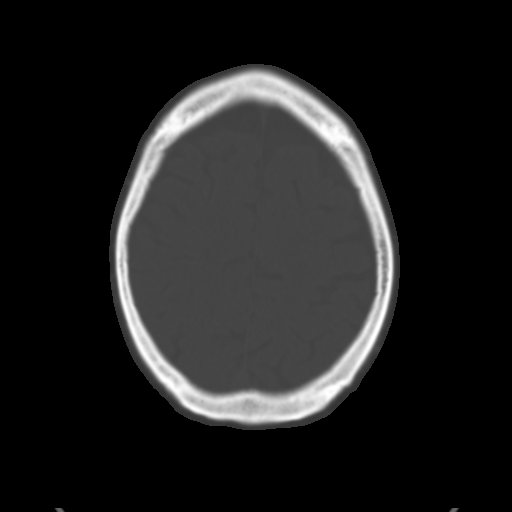
[im 23/33  brain]
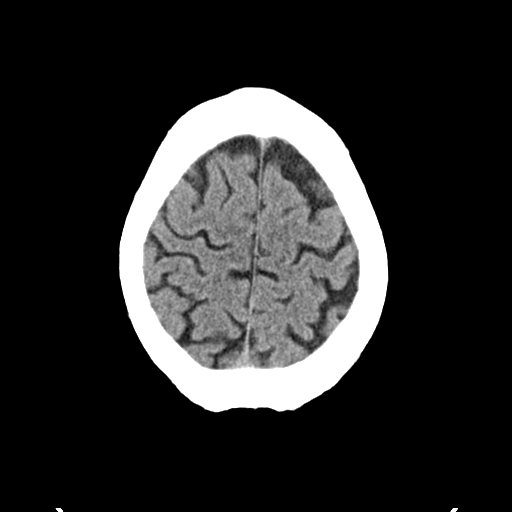
[im 26/33  brain]
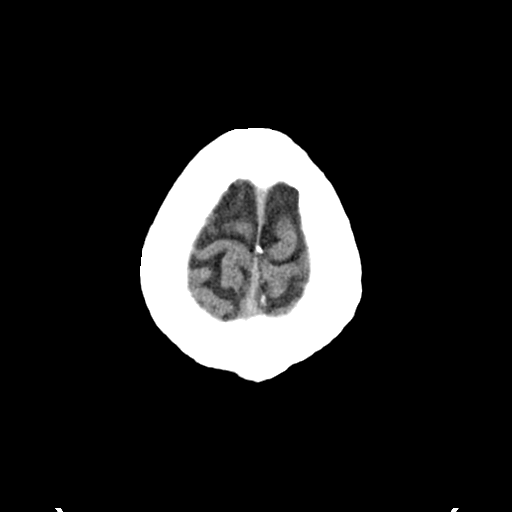
[im 28/33  brain]
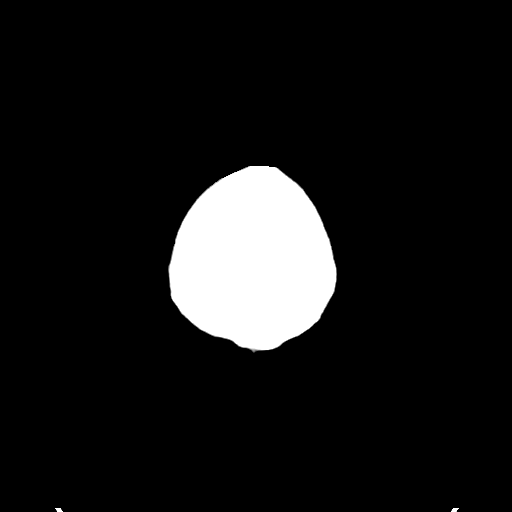
[im 30/33  brain]
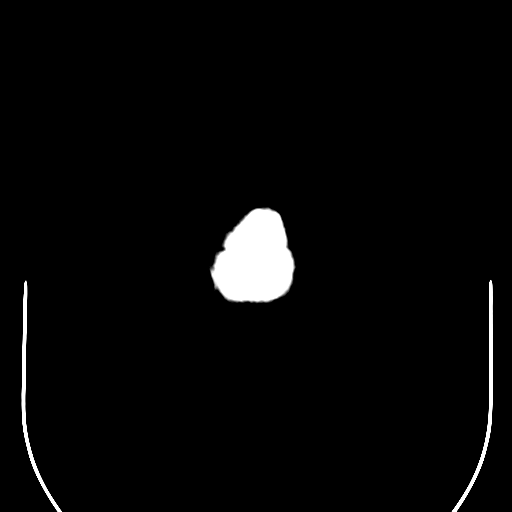
[im 30/33  bone]
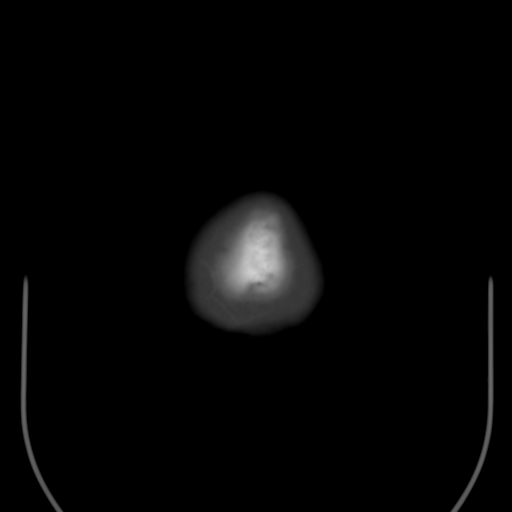

[13 of 30 positions shown; findings below may reference images not displayed]

FINDINGS: Brain: Cerebral volume is within normal limits for age.

No midline shift or intracranial mass effect. No ventriculomegaly.
Except for the mesial right temporal lobe, gray-white matter
differentiation appears normal throughout the brain, with no
encephalomalacia identified.

There is a round 14 mm diameter partially calcified hyperdense and
perhaps mildly enhancing masslike area in the mesial right temporal
lobe at the level of the amygdala. See series 2, image 7, series 3,
image 9, sagittal image 18 and coronal image 39. No surrounding
edema. No significant regional mass effect. No similar lesion
identified elsewhere. No other abnormal intracranial enhancement.

Vascular: Calcified atherosclerosis at the skull base. The major
intracranial vascular structures are enhancing and appear patent.

Skull: Prior surgical repair of the left lateral orbital wall. The
linear surgical clip like metallic foreign body seen
radiographically is intraosseous (series 5, image 17). Small
cerclage wire also noted along the left lateral orbital wall.

No acute osseous abnormality identified.

Sinuses/Orbits: Partially visible left maxillary sinus
mucoperiosteal thickening. The other visible paranasal sinuses are
well aerated. The tympanic cavities and mastoids are well
pneumatized.

Other: There is an enlarged but nonspecific left suboccipital
subcutaneous nodule or level 5 lymph node measuring 10-11
millimeters (series 4, image 1). No surrounding inflammation. Other
visible deep soft tissue spaces of the upper face and skull base
appear normal.

Other visible scalp soft tissues are within normal limits.

The visible bilateral intraorbital soft tissues appear normal. The
orbits soft tissues are not entirely included, but no intraorbital
metal is identified.
IMPRESSION: 1. Hyperdense, partially calcified, and perhaps mildly enhancing
round soft tissue mass at the mesial right temporal lobe at the
amygdala. Follow-up brain MRI without and with contrast (see #4)
would best characterize further.
Top differential considerations at this point include ganglioglioma,
oligodendroglioma, CNS lymphoma, less likely meningioma, high-grade
glioma, or AVM.
2. Normal for age CT appearance of the brain elsewhere.
3. Partially visible nonspecific left suboccipital scalp soft tissue
nodule versus mildly enlarged level 5 lymph node. Is there a dermal
lesion evident on physical exam which might explain this finding
(sebaceous cyst)?.
4. The left orbit associated metallic foreign bodies are
intraosseous. MRI of the brain in this patient should be safe to
perform with regard to the orbits.
